# Patient Record
Sex: Male | Born: 1960 | Race: White | Hispanic: No | Marital: Married | Smoking: Never smoker
Health system: Southern US, Community
[De-identification: ages and names within clinical notes are randomized; demographics above are authoritative.]

## PROBLEM LIST (undated history)

## (undated) DIAGNOSIS — K219 Gastro-esophageal reflux disease without esophagitis: Secondary | ICD-10-CM

## (undated) DIAGNOSIS — E785 Hyperlipidemia, unspecified: Secondary | ICD-10-CM

## (undated) DIAGNOSIS — G4730 Sleep apnea, unspecified: Secondary | ICD-10-CM

## (undated) DIAGNOSIS — I10 Essential (primary) hypertension: Secondary | ICD-10-CM

## (undated) HISTORY — DX: Gastro-esophageal reflux disease without esophagitis: K21.9

## (undated) HISTORY — PX: WISDOM TOOTH EXTRACTION: SHX21

## (undated) HISTORY — DX: Sleep apnea, unspecified: G47.30

## (undated) HISTORY — PX: OTHER SURGICAL HISTORY: SHX169

## (undated) HISTORY — DX: Essential (primary) hypertension: I10

## (undated) HISTORY — DX: Hyperlipidemia, unspecified: E78.5

---

## 2007-06-23 ENCOUNTER — Emergency Department (HOSPITAL_COMMUNITY): Admission: EM | Admit: 2007-06-23 | Discharge: 2007-06-23 | Payer: Self-pay | Admitting: Emergency Medicine

## 2014-09-25 ENCOUNTER — Encounter: Payer: Self-pay | Admitting: Gastroenterology

## 2014-10-15 ENCOUNTER — Ambulatory Visit (AMBULATORY_SURGERY_CENTER): Payer: Self-pay | Admitting: *Deleted

## 2014-10-15 VITALS — Ht 72.0 in | Wt 310.0 lb

## 2014-10-15 DIAGNOSIS — Z1211 Encounter for screening for malignant neoplasm of colon: Secondary | ICD-10-CM

## 2014-10-15 MED ORDER — MOVIPREP 100 G PO SOLR: ORAL | Status: DC

## 2014-10-15 NOTE — Progress Notes (Signed)
No egg or soy allergy  No anesthesia or intubation problems per pt  Takes Phentermine- told to stop 10-22-14  Registered in [facility]

## 2014-10-29 ENCOUNTER — Encounter: Payer: Self-pay | Admitting: Gastroenterology

## 2014-10-31 ENCOUNTER — Encounter: Payer: Self-pay | Admitting: Gastroenterology

## 2014-10-31 ENCOUNTER — Ambulatory Visit (AMBULATORY_SURGERY_CENTER): Payer: Federal, State, Local not specified - PPO | Admitting: Gastroenterology

## 2014-10-31 VITALS — BP 128/85 | HR 68 | Temp 98.6°F | Resp 12 | Ht 72.0 in | Wt 310.0 lb

## 2014-10-31 DIAGNOSIS — D125 Benign neoplasm of sigmoid colon: Secondary | ICD-10-CM

## 2014-10-31 DIAGNOSIS — Z1211 Encounter for screening for malignant neoplasm of colon: Secondary | ICD-10-CM

## 2014-10-31 DIAGNOSIS — D123 Benign neoplasm of transverse colon: Secondary | ICD-10-CM

## 2014-10-31 DIAGNOSIS — D122 Benign neoplasm of ascending colon: Secondary | ICD-10-CM

## 2014-10-31 MED ORDER — SODIUM CHLORIDE 0.9 % IV SOLN: 500.0000 mL | INTRAVENOUS | Status: DC

## 2014-10-31 NOTE — Op Note (Signed)
[place]  [address] [place], [id number]   COLONOSCOPY PROCEDURE REPORT  PATIENT: Kevin Prince, Kevin Prince  MR#: [id number] BIRTHDATE: Mar 09, 1961 , 53  yrs. old GENDER: male ENDOSCOPIST: Ladene Artist, MD, [facility] PROCEDURE DATE:  10/31/2014 PROCEDURE:   Colonoscopy with biopsy and Colonoscopy with snare polypectomy First Screening Colonoscopy - Avg.  risk and is 50 yrs.  old or older Yes.  Prior Negative Screening - Now for repeat screening. N/A  History of Adenoma - Now for follow-up colonoscopy & has been > or = to 3 yrs.  N/A  Polyps Removed Today? Yes. ASA CLASS:   Class II INDICATIONS:average risk patient for colorectal cancer. MEDICATIONS: Monitored anesthesia care, Propofol 350 mg IV, and lidocaine 40 mg IV DESCRIPTION OF PROCEDURE:   After the risks benefits and alternatives of the procedure were thoroughly explained, informed consent was obtained.  The digital rectal exam revealed no abnormalities of the rectum.   The [id number]  endoscope was introduced through the anus and advanced to the cecum, which was identified by both the appendix and ileocecal valve. No adverse events experienced.   The quality of the prep was good, using MoviPrep  The instrument was then slowly withdrawn as the colon was fully examined.  COLON FINDINGS: Two sessile polyps measuring 7 mm in size were found at the hepatic flexure and in the ascending colon.  Polypectomies were performed using snare cautery.  The resection was complete, the polyp tissue was completely retrieved and sent to histology.  A sessile polyp measuring 4 mm in size was found in the sigmoid colon.  A polypectomy was performed with cold forceps.  The resection was complete, the polyp tissue was completely retrieved and sent to histology.   There was mild diverticulosis noted in the transverse colon and ascending colon.   There was moderate diverticulosis noted in the sigmoid colon and descending  colon with associated colonic spasm and muscular hypertrophy. The examination was otherwise normal.  Retroflexed views revealed no abnormalities. The time to cecum=2 minutes 34 seconds.  Withdrawal time=13 minutes 46 seconds.  The scope was withdrawn and the procedure completed. COMPLICATIONS: There were no immediate complications.  ENDOSCOPIC IMPRESSION: 1.   Two sessile polyps at the hepatic flexure and in the ascending colon; polypectomies performed using snare cautery 2.   Sessile polyp in the sigmoid colon; polypectomy performed with cold forceps 4.   Mild diverticulosis in the transverse colon and ascending colon  5.   Moderate diverticulosis noted in the sigmoid colon and descending colon  RECOMMENDATIONS: 1.  Await pathology results 2.  Repeat colonoscopy in 5 years if polyp(s) adenomatous; otherwise 10 years 3.  High fiber diet with liberal fluid intake.  eSigned:  Ladene Artist, MD, [facility] 10/31/2014 2:06 PM

## 2014-10-31 NOTE — Progress Notes (Signed)
Called to room to assist during endoscopic procedure.  Patient ID and intended procedure confirmed with present staff. Received instructions for my participation in the procedure from the performing physician.  

## 2014-10-31 NOTE — Progress Notes (Signed)
Stable to RR 

## 2014-10-31 NOTE — Patient Instructions (Addendum)
Colon polyps removed today, and diverticulosis seen. Try to follow a high fiber diet with liberal fluid intake.   Handouts given on polyps, diverticulosis and high fiber diet. No aspirin, anti-inflammatory or NSAIDs for 2 weeks. Call us with any questions or concerns. Thank you!   YOU HAD AN ENDOSCOPIC PROCEDURE TODAY AT [facility]: Refer to the procedure report that was given to you for any specific questions about what was found during the examination.  If the procedure report does not answer your questions, please call your gastroenterologist to clarify.  If you requested that your care partner not be given the details of your procedure findings, then the procedure report has been included in a sealed envelope for you to review at your convenience later.  YOU SHOULD EXPECT: Some feelings of bloating in the abdomen. Passage of more gas than usual.  Walking can help get rid of the air that was put into your GI tract during the procedure and reduce the bloating. If you had a lower endoscopy (such as a colonoscopy or flexible sigmoidoscopy) you may notice spotting of blood in your stool or on the toilet paper. If you underwent a bowel prep for your procedure, then you may not have a normal bowel movement for a few days.  DIET: Your first meal following the procedure should be a light meal and then it is ok to progress to your normal diet.  A half-sandwich or bowl of soup is an example of a good first meal.  Heavy or fried foods are harder to digest and may make you feel nauseous or bloated.  Likewise meals heavy in dairy and vegetables can cause extra gas to form and this can also increase the bloating.  Drink plenty of fluids but you should avoid alcoholic beverages for 24 hours.  ACTIVITY: Your care partner should take you home directly after the procedure.  You should plan to take it easy, moving slowly for the rest of the day.  You can resume normal activity the day after the  procedure however you should NOT DRIVE or use heavy machinery for 24 hours (because of the sedation medicines used during the test).    SYMPTOMS TO REPORT IMMEDIATELY: A gastroenterologist can be reached at any hour.  During normal business hours, 8:30 AM to 5:00 PM Monday through Friday, call [phone].  After hours and on weekends, please call the GI answering service at [phone] who will take a message and have the physician on call contact you.   Following lower endoscopy (colonoscopy or flexible sigmoidoscopy):  Excessive amounts of blood in the stool  Significant tenderness or worsening of abdominal pains  Swelling of the abdomen that is new, acute  Fever of 100F or higher  Following upper endoscopy (EGD)  Vomiting of blood or coffee ground material  New chest pain or pain under the shoulder blades  Painful or persistently difficult swallowing  New shortness of breath  Fever of 100F or higher  Black, tarry-looking stools  FOLLOW UP: If any biopsies were taken you will be contacted by phone or by letter within the next 1-3 weeks.  Call your gastroenterologist if you have not heard about the biopsies in 3 weeks.  Our staff will call the home number listed on your records the next business day following your procedure to check on you and address any questions or concerns that you may have at that time regarding the information given to you following your procedure. This  is a courtesy call and so if there is no answer at the home number and we have not heard from you through the emergency physician on call, we will assume that you have returned to your regular daily activities without incident.  SIGNATURES/CONFIDENTIALITY: You and/or your care partner have signed paperwork which will be entered into your electronic medical record.  These signatures attest to the fact that that the information above on your After Visit Summary has been reviewed and is understood.  Full  responsibility of the confidentiality of this discharge information lies with you and/or your care-partner.

## 2014-11-01 ENCOUNTER — Telehealth: Payer: Self-pay

## 2014-11-01 NOTE — Telephone Encounter (Signed)
Left a message at [phone] for the pt to call back if any questions or concerns. maw

## 2014-11-11 ENCOUNTER — Encounter: Payer: Self-pay | Admitting: Gastroenterology

## 2016-09-24 ENCOUNTER — Other Ambulatory Visit: Payer: Self-pay | Admitting: Neurosurgery

## 2016-09-24 DIAGNOSIS — M4726 Other spondylosis with radiculopathy, lumbar region: Secondary | ICD-10-CM

## 2016-09-30 ENCOUNTER — Other Ambulatory Visit: Payer: Federal, State, Local not specified - PPO

## 2016-10-08 ENCOUNTER — Ambulatory Visit
Admission: RE | Admit: 2016-10-08 | Discharge: 2016-10-08 | Disposition: A | Discharge status: Home / Self Care | Payer: Federal, State, Local not specified - PPO | Source: Ambulatory Visit | Attending: Neurosurgery | Admitting: Neurosurgery

## 2016-10-08 DIAGNOSIS — M4726 Other spondylosis with radiculopathy, lumbar region: Secondary | ICD-10-CM

## 2018-06-01 IMAGING — MR MR LUMBAR SPINE W/O CM
4 of 5 series · 19 of 48 positions shown · non-contrast
Comparison: Plain films lumbar spine performed at [HOSPITAL]
[REDACTED] 09/11/2016.

CLINICAL DATA: Worsening chronic low back pain on the left side
radiating into the left hip with leg pain and weakness. No recent
injury.

EXAM:
MRI LUMBAR SPINE WITHOUT CONTRAST
TECHNIQUE: Multiplanar, multisequence MR imaging of the lumbar spine was
performed. No intravenous contrast was administered.

[Series 6: T2 · sagittal · 4.0mm · 0.73mm/px · 6 of 15 slices shown (1 of 2)]
[im 1/15]
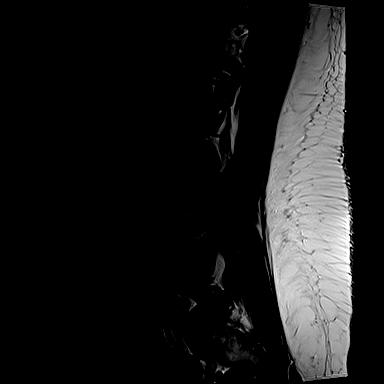
[im 3/15]
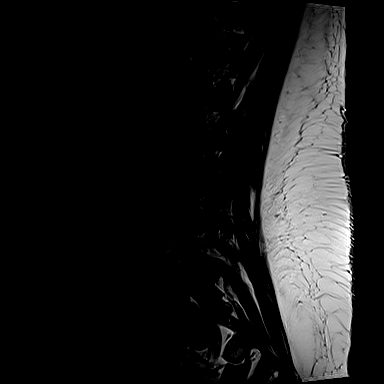
[im 6/15]
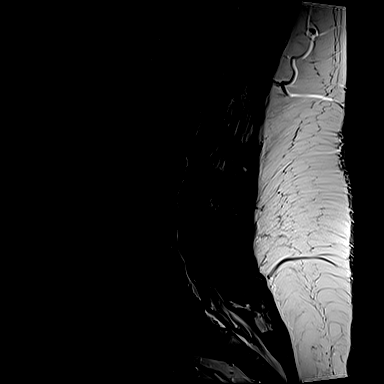
[im 9/15]
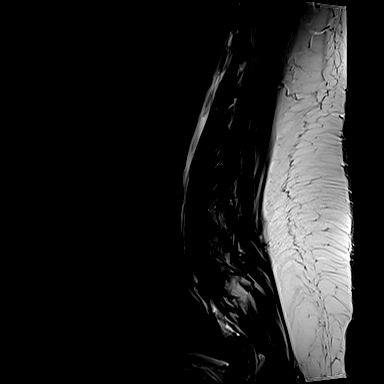
[im 12/15]
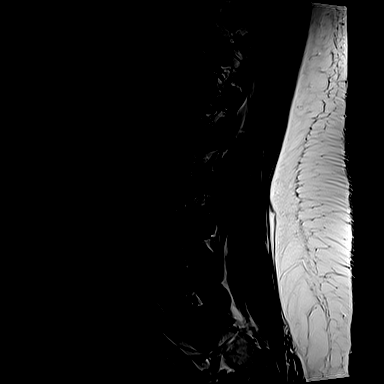
[im 15/15]
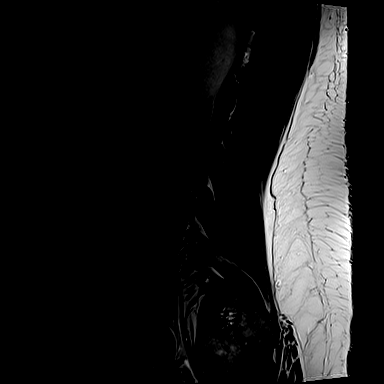

[Series 7: T1 · sagittal · 4.0mm · 0.88mm/px · 3 of 15 slices shown (1 of 2)]
[im 3/15]
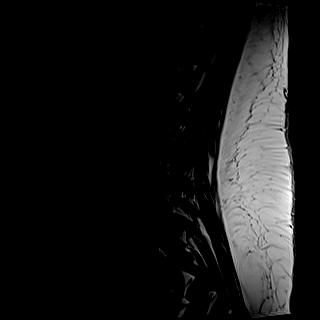
[im 9/15]
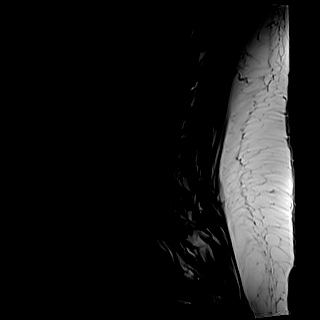
[im 15/15]
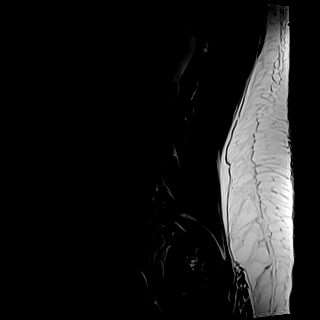

[Series 11: T2 · axial · 4.0mm · 0.28mm/px · z∈[-228,-29]mm · 7 of 41 slices shown (2 of 2)]
[im 1/41]
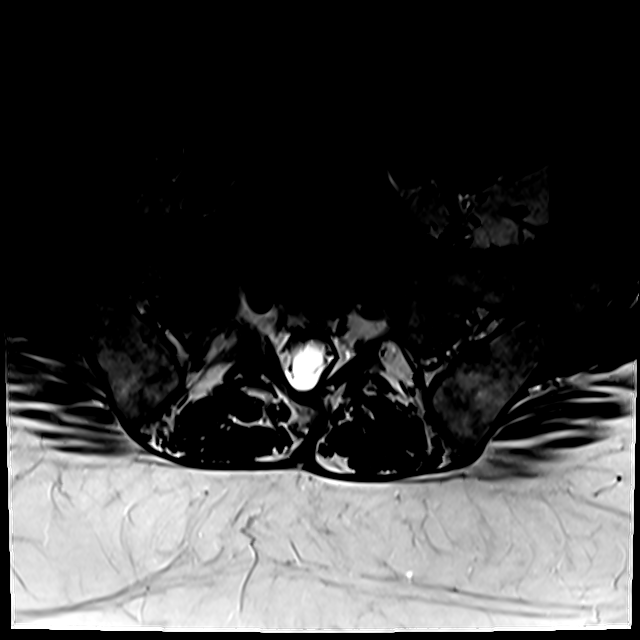
[im 6/41]
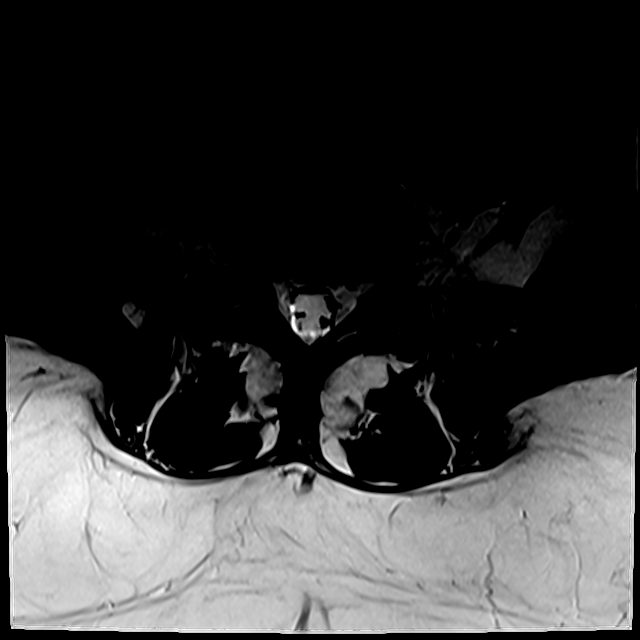
[im 12/41]
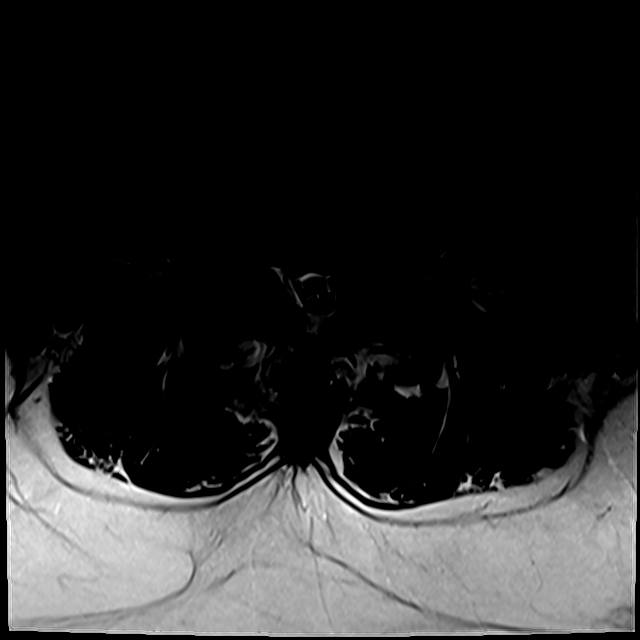
[im 18/41]
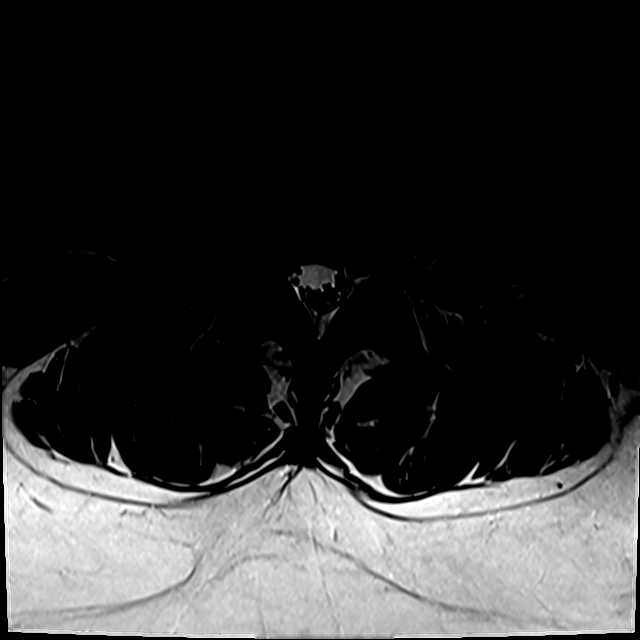
[im 21/41]
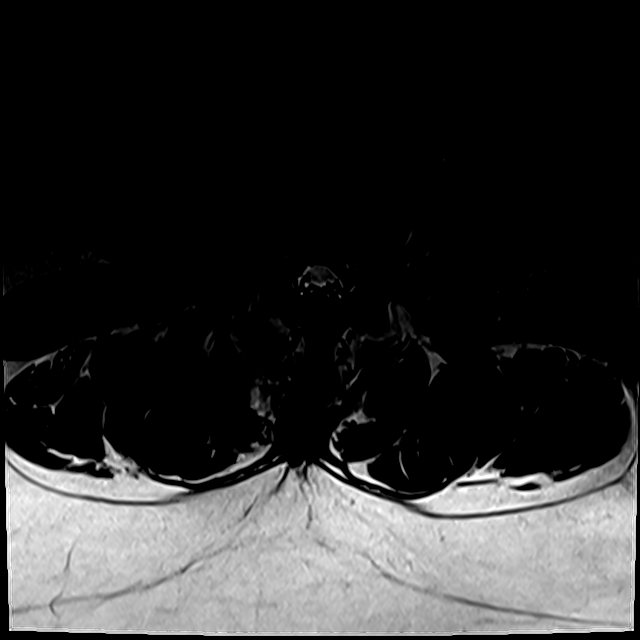
[im 23/41]
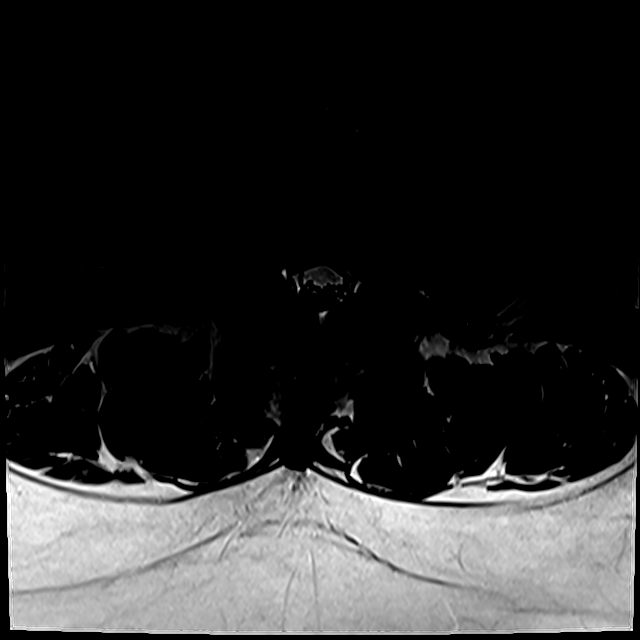
[im 35/41]
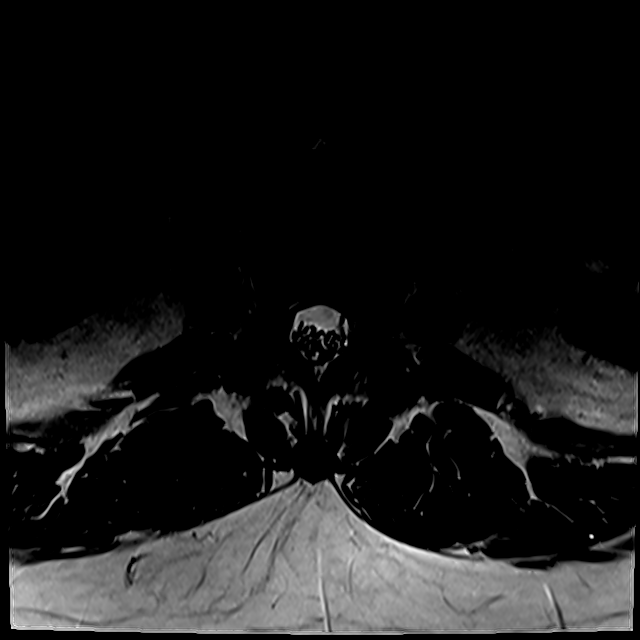

[Series 14: T1 · axial · 4.0mm · 0.28mm/px · z∈[-204,-29]mm · 3 of 41 slices shown (2 of 2)]
[im 6/41]
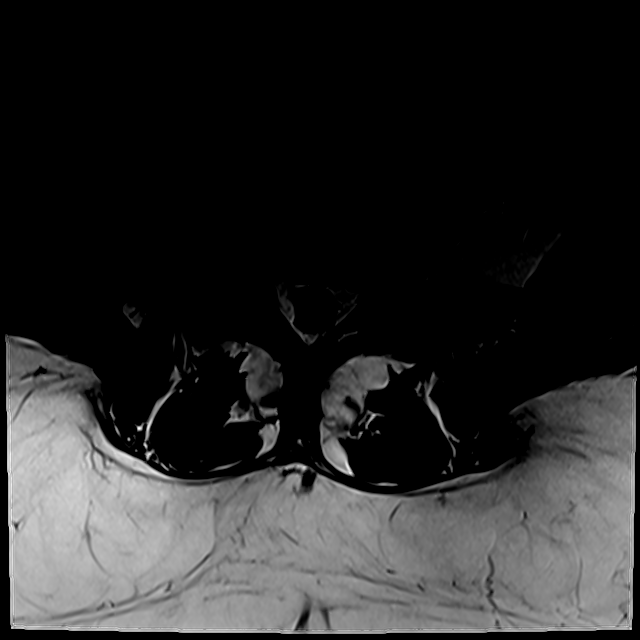
[im 21/41]
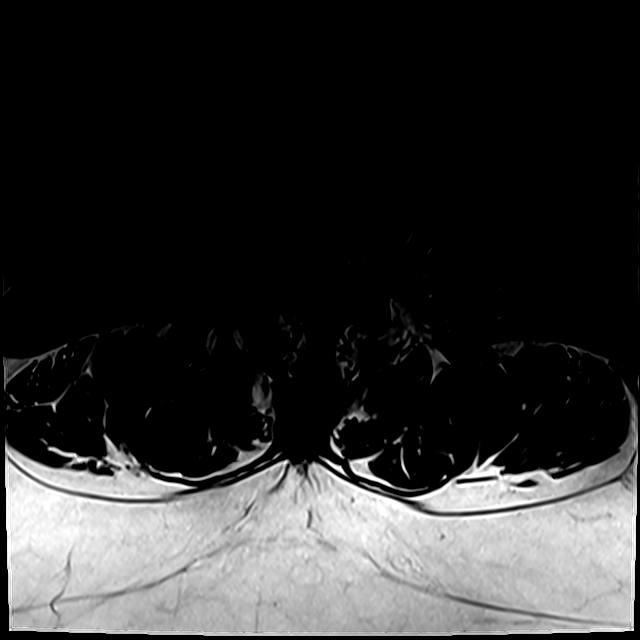
[im 35/41]
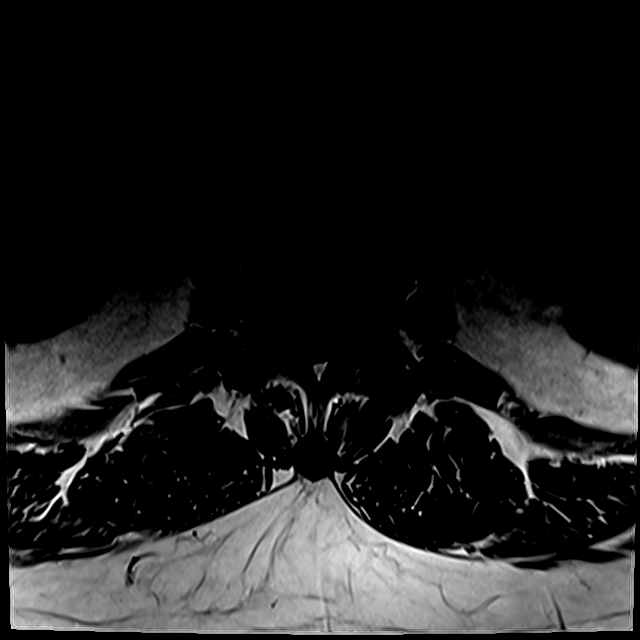

[19 of 48 positions shown; findings below may reference images not displayed]

FINDINGS: Segmentation:  Standard.

Alignment:  Maintained.

Vertebrae:  Height and signal are normal.

Conus medullaris: Extends to the L1 level and appears normal.

Paraspinal and other soft tissues: Negative.

Disc levels:

T11-12 is imaged in the sagittal plane only and negative.

T12-L1: Minimal disc bulge without central canal or foraminal
stenosis. Mild facet degenerative change is seen.

L1-2:  Negative.

L2-3:  Negative.

L3-4:  Negative.

L4-5: Advanced bilateral facet degenerative change is seen. A
synovial cyst off the medial margin of the left facet measures
cm transverse by 0.2 cm AP by approximately 0.5 cm craniocaudal. The
cyst does not appear to impact the descending left L5 root. The
thecal sac is patent. The foramina are open.

L5-S1: Moderate bilateral facet degenerative disease and a shallow
broad-based central protrusion are identified. The central spinal
canal neural foramina are open.
IMPRESSION: Facet degenerative change appearing worst at L4-5 where it is
advanced. Very small synovial cyst is seen in the subarticular
recess off the left facet joint but the cyst does not appear to
impact the descending left L5 root. The central canal and foramina
are open.

Shallow broad-based central protrusion L5-S1 without central canal
or foraminal stenosis.

## 2021-07-10 ENCOUNTER — Encounter: Payer: Self-pay | Admitting: Physician Assistant

## 2021-07-30 ENCOUNTER — Other Ambulatory Visit (INDEPENDENT_AMBULATORY_CARE_PROVIDER_SITE_OTHER): Payer: 59

## 2021-07-30 ENCOUNTER — Ambulatory Visit (INDEPENDENT_AMBULATORY_CARE_PROVIDER_SITE_OTHER): Payer: 59 | Admitting: Physician Assistant

## 2021-07-30 ENCOUNTER — Encounter: Payer: Self-pay | Admitting: Physician Assistant

## 2021-07-30 VITALS — BP 122/74 | HR 59 | Ht 72.0 in | Wt 331.0 lb

## 2021-07-30 DIAGNOSIS — Z1211 Encounter for screening for malignant neoplasm of colon: Secondary | ICD-10-CM

## 2021-07-30 DIAGNOSIS — Z1212 Encounter for screening for malignant neoplasm of rectum: Secondary | ICD-10-CM

## 2021-07-30 DIAGNOSIS — R7989 Other specified abnormal findings of blood chemistry: Secondary | ICD-10-CM | POA: Diagnosis not present

## 2021-07-30 LAB — CBC WITH DIFFERENTIAL/PLATELET
Basophils Absolute: 0.1 10*3/uL (ref 0.0–0.1)
Basophils Relative: 0.8 % (ref 0.0–3.0)
Eosinophils Absolute: 0.2 10*3/uL (ref 0.0–0.7)
Eosinophils Relative: 2.9 % (ref 0.0–5.0)
HCT: 43.1 % (ref 39.0–52.0)
Hemoglobin: 14.6 g/dL (ref 13.0–17.0)
Lymphocytes Relative: 14.7 % (ref 12.0–46.0)
Lymphs Abs: 1 10*3/uL (ref 0.7–4.0)
MCHC: 33.9 g/dL (ref 30.0–36.0)
MCV: 91.5 fl (ref 78.0–100.0)
Monocytes Absolute: 0.7 10*3/uL (ref 0.1–1.0)
Monocytes Relative: 9.4 % (ref 3.0–12.0)
Neutro Abs: 5.1 10*3/uL (ref 1.4–7.7)
Neutrophils Relative %: 72.2 % (ref 43.0–77.0)
Platelets: 246 10*3/uL (ref 150.0–400.0)
RBC: 4.71 Mil/uL (ref 4.22–5.81)
RDW: 13.1 % (ref 11.5–15.5)
WBC: 7 10*3/uL (ref 4.0–10.5)

## 2021-07-30 LAB — COMPREHENSIVE METABOLIC PANEL
ALT: 39 U/L (ref 0–53)
AST: 37 U/L (ref 0–37)
Albumin: 4.4 g/dL (ref 3.5–5.2)
Alkaline Phosphatase: 103 U/L (ref 39–117)
BUN: 10 mg/dL (ref 6–23)
CO2: 30 mEq/L (ref 19–32)
Calcium: 9.2 mg/dL (ref 8.4–10.5)
Chloride: 95 mEq/L — ABNORMAL LOW (ref 96–112)
Creatinine, Ser: 0.89 mg/dL (ref 0.40–1.50)
GFR: 94.49 mL/min (ref >60.00)
Glucose, Bld: 106 mg/dL — ABNORMAL HIGH (ref 70–99)
Potassium: 4.4 mEq/L (ref 3.5–5.1)
Sodium: 131 mEq/L — ABNORMAL LOW (ref 135–145)
Total Bilirubin: 0.7 mg/dL (ref 0.2–1.2)
Total Protein: 7.7 g/dL (ref 6.0–8.3)

## 2021-07-30 NOTE — Progress Notes (Addendum)
Chief Complaint: Elevated LFTs  HPI:    Mr. Kevin Prince is a 60 year old male with past medical history as listed below including reflux, known to Dr. Fuller Plan, who was referred to me by Etter Sjogren, FNP for a complaint of elevated LFTs    10/31/2014 colonoscopy with 2 sessile polyps of the hepatic flexure and in the ascending colon removed, sessile polyp in the sigmoid colon and mild diverticulosis in the transverse colon as well as moderate diverticulosis in the sigmoid and descending colon.  Repeat recommended in 5 years.  Pathology showed hyperplastic polyps and repeat was recommended in 10 years.    Today, the patient presents to clinic and tells me that he recently had elevated liver enzymes on his regular scheduled blood work about 3 to 4 months ago, he has not had labs drawn since then but was referred to our clinic.  (We do not have these labs at time of visit).  Never told they were elevated prior to this.  Does admit that he is "married to an [place]" and they tend to drink a lot of alcohol, "somewhat worse since the pandemic".  It is usually 4-6 beers per day and he has done this for the past few years at least.  Since hearing that his liver enzymes are elevated they are trying to "cut back".  Denies any other family history of autoimmune liver disease or liver problems.    Also discusses today that he has had fairly frequent episodes of diverticulitis.  Initially treated these with Cipro and Flagyl but this caused him to have lip swelling at one point so they have now changed to Augmentin.  Was treated for this 4 times over the past year by his PCP.  When he feels left lower quadrant pain coming on he does back off his diet/stop eating and sometimes he can stop it from getting any worse, but he often needs antibiotics.    Tells me he has gained around 60 pounds since the pandemic.    Denies fever, chills, heartburn, reflux, nausea, vomiting, abdominal distention or peripheral edema,  weight loss or blood in the stool.  Past Medical History:  Diagnosis Date   GERD (gastroesophageal reflux disease)    Hyperlipidemia    Hypertension    Sleep apnea    doesn't wear CPAP    Past Surgical History:  Procedure Laterality Date   varicose vein removed     from right lower leg   WISDOM TOOTH EXTRACTION      Current Outpatient Medications  Medication Sig Dispense Refill   Acetaminophen (TYLENOL PO) Take by mouth. PRN     atorvastatin (LIPITOR) 10 MG tablet Take 10 mg by mouth daily.     budesonide-formoterol (SYMBICORT) 80-4.5 MCG/ACT inhaler Inhale 2 puffs into the lungs 2 (two) times daily.     lisinopril (PRINIVIL,ZESTRIL) 10 MG tablet Take 10 mg by mouth daily.     meloxicam (MOBIC) 15 MG tablet Take 15 mg by mouth daily.     omeprazole (PRILOSEC) 40 MG capsule Take 40 mg by mouth daily.     phentermine 37.5 MG capsule Take 37.5 mg by mouth every morning.     venlafaxine (EFFEXOR) 75 MG tablet Take 75 mg by mouth 2 (two) times daily.     No current facility-administered medications for this visit.    Allergies as of 07/30/2021   (No Known Allergies)    Family History  Problem Relation Age of Onset   Colon cancer Neg Hx  Esophageal cancer Neg Hx    Stomach cancer Neg Hx    Rectal cancer Neg Hx    Heart disease Mother     Social History   Socioeconomic History   Marital status: Married    Spouse name: Not on file   Number of children: Not on file   Years of education: Not on file   Highest education level: Not on file  Occupational History   Not on file  Tobacco Use   Smoking status: Never   Smokeless tobacco: Never  Substance and Sexual Activity   Alcohol use: Yes    Comment: beer and bourbon- almost one daily   Drug use: No   Sexual activity: Not on file  Other Topics Concern   Not on file  Social History Narrative   Not on file   Social Determinants of Health   Financial Resource Strain: Not on file  Food Insecurity: Not on file   Transportation Needs: Not on file  Physical Activity: Not on file  Stress: Not on file  Social Connections: Not on file  Intimate Partner Violence: Not on file    Review of Systems:    Constitutional: No weight loss, fever or chills Skin: No rash  Cardiovascular: No chest pain Respiratory: No SOB Gastrointestinal: See HPI and otherwise negative Genitourinary: No dysuria  Neurological: No headache, dizziness or syncope Musculoskeletal: No new muscle or joint pain Hematologic: No bleeding  Psychiatric: No history of depression or anxiety   Physical Exam:  Vital signs: BP 122/74   Pulse (!) 59   Ht 6' (1.829 m)   Wt (!) 331 lb (150.1 kg)   BMI 44.89 kg/m   Constitutional:   Pleasant overweight Caucasian male appears to be in NAD, Well developed, Well nourished, alert and cooperative Head:  Normocephalic and atraumatic. Eyes:   PEERL, EOMI. No icterus. Conjunctiva pink. Ears:  Normal auditory acuity. Neck:  Supple Throat: Oral cavity and pharynx without inflammation, swelling or lesion.  Respiratory: Respirations even and unlabored. Lungs clear to auscultation bilaterally.   No wheezes, crackles, or rhonchi.  Cardiovascular: Normal S1, S2. No MRG. Regular rate and rhythm. No peripheral edema, cyanosis or pallor.  Gastrointestinal:  Soft, nondistended, nontender. No rebound or guarding. Normal bowel sounds. No appreciable masses or hepatomegaly. Rectal:  Not performed.  Msk:  Symmetrical without gross deformities. Without edema, no deformity or joint abnormality.  Neurologic:  Alert and  oriented x4;  grossly normal neurologically.  Skin:   Dry and intact without significant lesions or rashes. Psychiatric: Demonstrates good judgement and reason without abnormal affect or behaviors.  Requesting recent lab.  Assessment: 1.  Elevated LFTs: Per patient these were elevated 3 to 4 months ago at his annual office visit, has increased alcohol intake to at least 4-6 beers a night  over the past few years and has gained around 60 pounds; consider alcohol +/- fatty liver most likely 2.  Screening for colorectal cancer: Last colonoscopy with benign polyps in 2016 with repeat recommended in 10 years  Plan: 1.  Repeat CBC and CMP today. 2.  Scheduled patient for right upper quadrant ultrasound. 3.  We will request recent labs from patient's PCP, pending those and what we find today he would likely require further blood work.  We discussed this today. 4.  Encouraged the patient to abstain from alcohol or at least decrease at this point.  Discussed that the alcohol and/or his weight gain are likely causes of his elevated liver enzymes  recently. 5.  Patient to follow in clinic per recommendations after labs today.  Ellouise Newer, PA-C [facility] 07/30/2021, 11:29 AM  Cc: Etter Sjogren, [place]   09/01/2021 10:48 AM Addendum:  Received records from patient's PCP.  03/13/2020 CMP with normal liver enzymes.  03/20/2021 AST 45, ALT 54 and others normal.  No change in plans.  Ellouise Newer, PA-C

## 2021-07-30 NOTE — Patient Instructions (Signed)
Your provider has requested that you go to the basement level for lab work before leaving today. Press "B" on the elevator. The lab is located at the first door on the left as you exit the elevator.  Stop all alcohol use!   Continue to work on slow and steady weight loss.   You have been scheduled for an abdominal ultrasound at [facility] (1st floor of hospital) on Monday 08/04/21 at 9 am. Please arrive 15 minutes prior to your appointment for registration. Make certain not to have anything to eat or drink 6 hours prior to your appointment. Should you need to reschedule your appointment, please contact radiology at [phone]. This test typically takes about 30 minutes to perform.  If you are age 58 or older, your body mass index should be between 23-30. Your Body mass index is 44.89 kg/m. If this is out of the aforementioned range listed, please consider follow up with your Primary Care Provider.  If you are age 51 or younger, your body mass index should be between 19-25. Your Body mass index is 44.89 kg/m. If this is out of the aformentioned range listed, please consider follow up with your Primary Care Provider.   ________________________________________________________  The [place] [facility] providers would like to encourage you to use [facility] to communicate with providers for non-urgent requests or questions.  Due to long hold times on the telephone, sending your provider a message by [facility] may be a faster and more efficient way to get a response.  Please allow 48 business hours for a response.  Please remember that this is for non-urgent requests.  _______________________________________________________

## 2021-07-30 NOTE — Progress Notes (Signed)
Reviewed and agree with management plan.  Davit Vassar T. Shanetha Bradham, MD FACG 

## 2021-08-01 ENCOUNTER — Telehealth: Payer: Self-pay | Admitting: Physician Assistant

## 2021-08-01 NOTE — Telephone Encounter (Signed)
Lab results faxed to Sandy's attention at fax number provided below. Faxed through epic.

## 2021-08-01 NOTE — Telephone Encounter (Signed)
Lovey Newcomer, from [facility] in [place], [place], called to request this patient's bloodwork results.  If you could, please fax them to:  ATTN:  Lovey Newcomer Fax #:   [phone]  Thank you.

## 2021-08-04 ENCOUNTER — Telehealth: Payer: Self-pay | Admitting: Physician Assistant

## 2021-08-04 ENCOUNTER — Ambulatory Visit (HOSPITAL_COMMUNITY): Payer: 59 | Attending: Physician Assistant

## 2021-08-04 ENCOUNTER — Encounter (HOSPITAL_COMMUNITY): Payer: Self-pay

## 2021-08-08 NOTE — Telephone Encounter (Signed)
Spoke with patient and he would like to have his ultrasound done at [place]. Will call and fax order to schedule ultrasound.

## 2021-08-08 NOTE — Telephone Encounter (Signed)
Left message for patient to call office.  

## 2021-08-11 ENCOUNTER — Other Ambulatory Visit: Payer: Self-pay | Admitting: *Deleted

## 2021-08-11 DIAGNOSIS — R7989 Other specified abnormal findings of blood chemistry: Secondary | ICD-10-CM

## 2021-08-11 NOTE — Telephone Encounter (Signed)
Spoke with [facility] and scheduled ultrasound for 08/29/21 @ 7:30 am. Spoke with patient and informed him of date and time of ultrasound. Faxed order to imaging center.

## 2021-09-01 ENCOUNTER — Other Ambulatory Visit: Payer: Self-pay | Admitting: Physician Assistant

## 2021-09-01 ENCOUNTER — Telehealth: Payer: Self-pay

## 2021-09-01 NOTE — Telephone Encounter (Signed)
Lm on home vm for patient to return call.  

## 2021-09-01 NOTE — Telephone Encounter (Signed)
-----   Message from Levin Erp, [place] sent at 09/01/2021  2:46 PM EST ----- Regarding: Elevated LFTs/ultrasound Please let the patient know that I got his ultrasound results.  They were completely normal.  Also his recheck of liver enzymes in our clinic was normal.  I do not believe there is any need for further work-up of this at this time.  Please ask him if he has any further questions.  Thanks- Ellouise Newer, PA-C.

## 2021-09-01 NOTE — Progress Notes (Signed)
Lab results  09/01/2021 2:44 PM  Right upper quadrant ultrasound for elevated liver enzymes was unremarkable.  Given this and patient's normal liver enzymes on recheck 07/30/2021 there is no need for further work-up.  I will have my nurse call and let him know.  Ellouise Newer, PA-C.

## 2021-09-03 NOTE — Telephone Encounter (Signed)
Called and spoke with patient in regards to results and Jennifer's recommendations. Pt verbalized understanding and had no concerns at the end of the call.
